# Patient Record
Sex: Male | Born: 1990 | Race: Black or African American | Hispanic: No | Marital: Single | Smoking: Current every day smoker
Health system: Southern US, Community
[De-identification: ages and names within clinical notes are randomized; demographics above are authoritative.]

## PROBLEM LIST (undated history)

## (undated) DIAGNOSIS — R55 Syncope and collapse: Secondary | ICD-10-CM

---

## 2012-03-11 ENCOUNTER — Emergency Department (HOSPITAL_COMMUNITY)
Admission: EM | Admit: 2012-03-11 | Discharge: 2012-03-11 | Disposition: A | Discharge status: Home / Self Care | Payer: Medicaid Other | Attending: Emergency Medicine | Admitting: Emergency Medicine

## 2012-03-11 ENCOUNTER — Encounter (HOSPITAL_COMMUNITY): Payer: Self-pay

## 2012-03-11 DIAGNOSIS — J988 Other specified respiratory disorders: Secondary | ICD-10-CM

## 2012-03-11 DIAGNOSIS — B9789 Other viral agents as the cause of diseases classified elsewhere: Secondary | ICD-10-CM

## 2012-03-11 DIAGNOSIS — F17200 Nicotine dependence, unspecified, uncomplicated: Secondary | ICD-10-CM | POA: Insufficient documentation

## 2012-03-11 DIAGNOSIS — H9209 Otalgia, unspecified ear: Secondary | ICD-10-CM

## 2012-03-11 MED ORDER — ANTIPYRINE-BENZOCAINE 5.4-1.4 % OT SOLN
3.0000 [drp] | Freq: Once | OTIC | Status: AC
  Administered 2012-03-11: 3 [drp] via OTIC
  Filled 2012-03-11: qty 10

## 2012-03-11 NOTE — ED Notes (Signed)
Pt discharged. Pt stable at time of discharge. Medications reviewed pt has no questions regarding discharge at this time. Pt voiced understanding of discharge instructions.  

## 2012-03-11 NOTE — ED Notes (Signed)
Right ear pain per pt. Started yesterday per pt.

## 2012-03-11 NOTE — ED Provider Notes (Signed)
History     CSN: [id number]  Arrival date & time 03/11/12  0040   First MD Initiated Contact with Patient 03/11/12 [phone]      Chief Complaint  Patient presents with  . Otalgia    (Consider location/radiation/quality/duration/timing/severity/associated sxs/prior treatment) HPI  Hunter Perkins is a 21 y.o. male who presents to the Emergency Department complaining of right ear pain that began yesterday when yawning, chewing, opening mouth wide. He has had cold symptoms for several days. Denies fever, chills, ear drainage.  History reviewed. No pertinent past medical history.  History reviewed. No pertinent past surgical history.  History reviewed. No pertinent family history.  History  Substance Use Topics  . Smoking status: Current Everyday Smoker -- 1.5 packs/day    Types: Cigarettes  . Smokeless tobacco: Not on file  . Alcohol Use: No      Review of Systems  Constitutional: Negative for fever.       10 Systems reviewed and are negative for acute change except as noted in the HPI.  HENT: Positive for ear pain, congestion, sneezing and postnasal drip.   Eyes: Negative for discharge and redness.  Respiratory: Positive for cough. Negative for shortness of breath.   Cardiovascular: Negative for chest pain.  Gastrointestinal: Negative for vomiting and abdominal pain.  Musculoskeletal: Negative for back pain.  Skin: Negative for rash.  Neurological: Negative for syncope, numbness and headaches.  Psychiatric/Behavioral:       No behavior change.    Allergies  Review of patient's allergies indicates no known allergies.  Home Medications  No current outpatient prescriptions on file.  BP 121/81  Pulse 75  Temp 98.4 F (36.9 C) (Oral)  Resp 16  Ht 6' (1.829 m)  Wt 210 lb (95.255 kg)  BMI 28.48 kg/m2  SpO2 99%  Physical Exam  Nursing note and vitals reviewed. Constitutional: He is oriented to person, place, and time. He appears well-developed and well-nourished.       Awake, alert, nontoxic appearance.  HENT:  Head: Normocephalic and atraumatic.  Right Ear: External ear normal.  Left Ear: External ear normal.  Mouth/Throat: Oropharynx is clear and moist.  Eyes: Conjunctivae and EOM are normal. Pupils are equal, round, and reactive to light. Right eye exhibits no discharge. Left eye exhibits no discharge.  Neck: Neck supple.  Cardiovascular: Normal heart sounds.   Pulmonary/Chest: Effort normal. No respiratory distress. He has no wheezes. He has no rales. He exhibits no tenderness.  Abdominal: Soft. There is no tenderness. There is no rebound.  Musculoskeletal: He exhibits no tenderness.       Baseline ROM, no obvious new focal weakness.  Neurological: He is alert and oriented to person, place, and time.       Mental status and motor strength appears baseline for patient and situation.  Skin: No rash noted.  Psychiatric: He has a normal mood and affect.    ED Course  Procedures (including critical care time)  Labs Reviewed - No data to display No results found.   No diagnosis found.    MDM  Patient with cold symptoms for several days that began having right ear pain. PE unremarkable. Given auralgan ear drops with some relief.Pt stable in ED with no significant deterioration in condition.The patient appears reasonably screened and/or stabilized for discharge and I doubt any other medical condition or other [facility] requiring further screening, evaluation, or treatment in the ED at this time prior to discharge.  MDM Reviewed: nursing note and vitals  Nicoletta Dress. Colon Branch, MD 03/11/12 [phone]

## 2012-05-08 ENCOUNTER — Emergency Department (HOSPITAL_COMMUNITY): Payer: Medicaid Other

## 2012-05-08 ENCOUNTER — Encounter (HOSPITAL_COMMUNITY): Payer: Self-pay | Admitting: *Deleted

## 2012-05-08 ENCOUNTER — Emergency Department (HOSPITAL_COMMUNITY)
Admission: EM | Admit: 2012-05-08 | Discharge: 2012-05-08 | Disposition: A | Discharge status: Home / Self Care | Payer: Medicaid Other | Attending: Emergency Medicine | Admitting: Emergency Medicine

## 2012-05-08 DIAGNOSIS — F17200 Nicotine dependence, unspecified, uncomplicated: Secondary | ICD-10-CM | POA: Insufficient documentation

## 2012-05-08 DIAGNOSIS — S93601A Unspecified sprain of right foot, initial encounter: Secondary | ICD-10-CM

## 2012-05-08 DIAGNOSIS — S93609A Unspecified sprain of unspecified foot, initial encounter: Secondary | ICD-10-CM | POA: Insufficient documentation

## 2012-05-08 DIAGNOSIS — Y9367 Activity, basketball: Secondary | ICD-10-CM | POA: Insufficient documentation

## 2012-05-08 DIAGNOSIS — Y929 Unspecified place or not applicable: Secondary | ICD-10-CM | POA: Insufficient documentation

## 2012-05-08 DIAGNOSIS — X58XXXA Exposure to other specified factors, initial encounter: Secondary | ICD-10-CM | POA: Insufficient documentation

## 2012-05-08 MED ORDER — HYDROCODONE-ACETAMINOPHEN 5-325 MG PO TABS: 1.0000 | ORAL_TABLET | Freq: Four times a day (QID) | ORAL | Status: AC | PRN

## 2012-05-08 MED ORDER — IBUPROFEN 800 MG PO TABS
800.0000 mg | ORAL_TABLET | Freq: Once | ORAL | Status: AC
  Administered 2012-05-08: 800 mg via ORAL
  Filled 2012-05-08: qty 1

## 2012-05-08 MED ORDER — HYDROCODONE-ACETAMINOPHEN 5-325 MG PO TABS
1.0000 | ORAL_TABLET | Freq: Once | ORAL | Status: AC
  Administered 2012-05-08: 1 via ORAL
  Filled 2012-05-08: qty 1

## 2012-05-08 NOTE — ED Notes (Signed)
Pain rt foot , injury when playing basketball

## 2012-05-08 NOTE — ED Provider Notes (Signed)
History     CSN: [id number]  Arrival date & time 05/08/12  1808   None     Chief Complaint  Patient presents with  . Foot Pain    (Consider location/radiation/quality/duration/timing/severity/associated sxs/prior treatment) HPI Comments: Running while playing basketball.  Attempted  Stopping quickly and inverted foot.  Denies any ankle pain.  No other complaints.  Patient is a 21 y.o. male presenting with lower extremity pain. The history is provided by the patient. No language interpreter was used.  Foot Pain This is a new problem. The current episode started today. The problem occurs constantly. The problem has been unchanged. Pertinent negatives include no numbness or weakness. The symptoms are aggravated by walking and standing. He has tried nothing for the symptoms.    History reviewed. No pertinent past medical history.  History reviewed. No pertinent past surgical history.  History reviewed. No pertinent family history.  History  Substance Use Topics  . Smoking status: Current Every Day Smoker -- 1.5 packs/day    Types: Cigarettes  . Smokeless tobacco: Not on file  . Alcohol Use: Yes     Comment: occ      Review of Systems  Musculoskeletal:       Foot inj and pain   Skin: Negative for wound.  Neurological: Negative for weakness and numbness.  All other systems reviewed and are negative.    Allergies  Review of patient's allergies indicates no known allergies.  Home Medications   Current Outpatient Rx  Name  Route  Sig  Dispense  Refill  . HYDROCODONE-ACETAMINOPHEN 5-325 MG PO TABS   Oral   Take 1 tablet by mouth every 6 (six) hours as needed for pain.   20 tablet   0     BP 121/77  Pulse 88  Temp 98.3 F (36.8 C) (Oral)  Resp 20  Ht 6\' 1"  (1.854 m)  Wt 210 lb (95.255 kg)  BMI 27.71 kg/m2  SpO2 100%  Physical Exam  Nursing note and vitals reviewed. Constitutional: He is oriented to person, place, and time. He appears well-developed and  well-nourished.  HENT:  Head: Normocephalic and atraumatic.  Eyes: EOM are normal.  Neck: Normal range of motion.  Cardiovascular: Normal rate, regular rhythm and intact distal pulses.   Pulmonary/Chest: Effort normal. No respiratory distress.  Abdominal: Soft. He exhibits no distension. There is no tenderness.  Musculoskeletal: He exhibits tenderness.       Right foot: He exhibits decreased range of motion, tenderness and bony tenderness. He exhibits no swelling, normal capillary refill, no crepitus, no deformity and no laceration.       Feet:  Neurological: He is alert and oriented to person, place, and time.  Skin: Skin is warm and dry.  Psychiatric: He has a normal mood and affect. Judgment normal.    ED Course  Procedures (including critical care time)  Labs Reviewed - No data to display Dg Foot Complete Right  05/08/2012  *RADIOLOGY REPORT*  Clinical Data: Ankle injury  RIGHT FOOT COMPLETE - 3+ VIEW  Comparison: None.  Findings: No fracture  or dislocation of midfoot or forefoot.  The phalanges are normal.  The calcaneus is normal.  No soft tissue abnormality.  IMPRESSION: No acute osseous abnormality.   Original Report Authenticated By: Genevive Bi, M.D.      1. Right foot sprain       MDM  ASO, crutches, ice, ibuprofen rx-hydrocodone, 20 F/u with dr. Hilda Lias  Evalina Field, [place] 05/08/12 [phone]

## 2012-05-08 NOTE — ED Provider Notes (Signed)
Medical screening examination/treatment/procedure(s) were performed by non-physician practitioner and as supervising physician I was immediately available for consultation/collaboration.  Cecylia Brazill, MD 05/08/12 1957 

## 2013-08-13 ENCOUNTER — Emergency Department (HOSPITAL_COMMUNITY)
Admission: EM | Admit: 2013-08-13 | Discharge: 2013-08-13 | Disposition: A | Discharge status: Home / Self Care | Payer: Medicaid Other | Attending: Emergency Medicine | Admitting: Emergency Medicine

## 2013-08-13 ENCOUNTER — Emergency Department (HOSPITAL_COMMUNITY): Payer: Medicaid Other

## 2013-08-13 ENCOUNTER — Encounter (HOSPITAL_COMMUNITY): Payer: Self-pay | Admitting: Emergency Medicine

## 2013-08-13 DIAGNOSIS — W108XXA Fall (on) (from) other stairs and steps, initial encounter: Secondary | ICD-10-CM | POA: Insufficient documentation

## 2013-08-13 DIAGNOSIS — Y929 Unspecified place or not applicable: Secondary | ICD-10-CM | POA: Insufficient documentation

## 2013-08-13 DIAGNOSIS — F17200 Nicotine dependence, unspecified, uncomplicated: Secondary | ICD-10-CM | POA: Insufficient documentation

## 2013-08-13 DIAGNOSIS — X500XXA Overexertion from strenuous movement or load, initial encounter: Secondary | ICD-10-CM | POA: Insufficient documentation

## 2013-08-13 DIAGNOSIS — S62319A Displaced fracture of base of unspecified metacarpal bone, initial encounter for closed fracture: Secondary | ICD-10-CM | POA: Insufficient documentation

## 2013-08-13 DIAGNOSIS — Y9301 Activity, walking, marching and hiking: Secondary | ICD-10-CM | POA: Insufficient documentation

## 2013-08-13 MED ORDER — HYDROCODONE-ACETAMINOPHEN 5-325 MG PO TABS: 1.0000 | ORAL_TABLET | ORAL | Status: DC | PRN

## 2013-08-13 MED ORDER — IBUPROFEN 800 MG PO TABS: 800.0000 mg | ORAL_TABLET | Freq: Three times a day (TID) | ORAL | Status: DC

## 2013-08-13 NOTE — ED Provider Notes (Signed)
Medical screening examination/treatment/procedure(s) were performed by non-physician practitioner and as supervising physician I was immediately available for consultation/collaboration.  EKG Interpretation   None        Chakia Counts R. Makaleigh Reinard, MD 08/13/13 2341 

## 2013-08-13 NOTE — ED Notes (Signed)
Holding daughter last night while going up steps and fell.  Fell on right hand and hyperextended fingers.  Right hand swollen.

## 2013-08-13 NOTE — Discharge Instructions (Signed)
You have a fracture of the bone behind the third finger of your right hand. Please keep the splint clean and dry. Please see Dr. Hilda LiasKeeling tomorrow in the office. Keep your hand elevated above your heart is much as possible. Use ibuprofen 3 times daily, use Norco every 4 hours if needed for pain. Metacarpal Fractures Fractures of metacarpals are breaks in the bones of the hand. They extend from the knuckles to the wrist. These bones can break in many ways. There are different ways of treating these fractures. HOME CARE  Only exercise as told by your doctor.  Return to activities as told by your doctor.  Go to physical therapy as told by your doctor.  Follow your doctor's advice about driving.  Keep the injured hand raised (elevated) above the level of your heart.  If a plaster, fiberglass, or pre-formed splint was applied:  Wear your splint as told and until you are examined again.  Apply ice on the injury for 15-20 minutes at a time, 03-04 times a day. Put the ice in a plastic bag. Place a towel between your skin and the bag.  Do not get your splint or cast wet. Protect it during bathing with a plastic bag.  Loosen the elastic bandage around the splint if your fingers start to get numb, tingle, get cold, or turn blue.  If the splint is plaster, do not lean it on hard surfaces or put pressure on it for 24 hours after it is put on.  Do not  try to scratch the skin under the cast.  Check the skin around the cast every day. You may put lotion on red or sore areas.  Move the fingers of your casted hand several times a day.  Only take medicine as told by your doctor.  Follow up as told by your doctor. This is very important in order to avoid permanent injury, disability, or lasting (chronic) pain. GET HELP RIGHT AWAY IF:   You develop a rash.  You have problems breathing.  You have any allergy problems.  You have more than a small spot of blood from beneath your cast or  splint.  You have redness, puffiness (swelling), or more pain from beneath your cast or splint.  Yellowish white fluid (pus) comes from beneath your cast or splint.  You develop a temperature by mouth above 102 F (38.9 C), not controlled by medicne.  You have a bad smell coming from under your cast or splint.  You have problems moving any of your fingers. If you do not have a window in your cast for looking at the wound, a fluid or a little bleeding may show up as a stain on the outside of your cast. Tell your doctor about any stains you see. MAKE SURE YOU:   Understand these instructions.  Will watch your condition.  Will get help right away if you are not doing well or get worse. Document Released: 12/06/2007 Document Revised: 09/11/2011 Document Reviewed: 05/25/2009 [facility] [employer] [employer] Patient Information 2014 [place], [place].

## 2013-08-13 NOTE — ED Provider Notes (Signed)
CSN: [id number]     Arrival date & time 08/13/13  1354 History   First MD Initiated Contact with Patient 08/13/13 1520     Chief Complaint  Patient presents with  . Hand Injury     (Consider location/radiation/quality/duration/timing/severity/associated sxs/prior Treatment) HPI Comments: Patient states that on last evening was going up steps. He was holding his daughter when he fell and hyperextended the fingers of his right hand. He noticed a pop, also noticed swelling and pain. The pain was worse this morning, so the patient came to the emergency department for evaluation. Patient denies any previous operations or procedures on the right hand. He reports no history of bleeding disorders, and he is not on any blood thinning type medications. Patient has tried Tylenol for the pain but this has not been successful.  The history is provided by the patient.    History reviewed. No pertinent past medical history. History reviewed. No pertinent past surgical history. History reviewed. No pertinent family history. History  Substance Use Topics  . Smoking status: Current Every Day Smoker -- 1.50 packs/day    Types: Cigarettes  . Smokeless tobacco: Not on file  . Alcohol Use: Yes     Comment: occ    Review of Systems  Constitutional: Negative for activity change.       All ROS Neg except as noted in HPI  HENT: Negative for nosebleeds.   Eyes: Negative for photophobia and discharge.  Respiratory: Negative for cough, shortness of breath and wheezing.   Cardiovascular: Negative for chest pain and palpitations.  Gastrointestinal: Negative for abdominal pain and blood in stool.  Genitourinary: Negative for dysuria, frequency and hematuria.  Musculoskeletal: Negative for arthralgias, back pain and neck pain.  Skin: Negative.   Neurological: Negative for dizziness, seizures and speech difficulty.  Psychiatric/Behavioral: Negative for hallucinations and confusion.      Allergies  Review  of patient's allergies indicates no known allergies.  Home Medications  No current outpatient prescriptions on file. BP 132/80  Pulse 80  Temp(Src) 98.3 F (36.8 C) (Oral)  Resp 18  SpO2 100% Physical Exam  Nursing note and vitals reviewed. Constitutional: He is oriented to person, place, and time. He appears well-developed and well-nourished.  Non-toxic appearance.  HENT:  Head: Normocephalic.  Right Ear: Tympanic membrane and external ear normal.  Left Ear: Tympanic membrane and external ear normal.  Eyes: EOM and lids are normal. Pupils are equal, round, and reactive to light.  Neck: Normal range of motion. Neck supple. Carotid bruit is not present.  Cardiovascular: Normal rate, regular rhythm, normal heart sounds, intact distal pulses and normal pulses.   Pulmonary/Chest: Breath sounds normal. No respiratory distress.  Abdominal: Soft. Bowel sounds are normal. There is no tenderness. There is no guarding.  Musculoskeletal:       Right hand: He exhibits decreased range of motion and swelling. He exhibits normal capillary refill. Normal sensation noted.       Hands: Lymphadenopathy:       Head (right side): No submandibular adenopathy present.       Head (left side): No submandibular adenopathy present.    He has no cervical adenopathy.  Neurological: He is alert and oriented to person, place, and time. He has normal strength. No cranial nerve deficit or sensory deficit.  Skin: Skin is warm and dry.  Psychiatric: He has a normal mood and affect. His speech is normal.    ED Course  Procedures (including critical care time) Labs Review Labs  Reviewed - No data to display Imaging Review Dg Hand Complete Right  08/13/2013   CLINICAL DATA:  Right hand pain and swelling after a fall last night.  EXAM: RIGHT HAND - COMPLETE 3+ VIEW  COMPARISON:  None.  FINDINGS: There is a oblique comminuted fracture through the base of the third metacarpal. I suspect the fracture extends to the  articular surface. The other osseous structures are intact.  IMPRESSION: Unusual fracture of the radial aspect of the base of the third metacarpal.   Electronically Signed   By: Geanie Cooley M.D.   On: 08/13/2013 14:32    EKG Interpretation   None       MDM   Final diagnoses:  None    *I have reviewed nursing notes, vital signs, and all appropriate lab and imaging results for this patient.** X-ray of the right hand reveals a fracture of the radial aspect of the base of the third metacarpal. Vital signs are well within normal limits. Pulse oximetry is 100% on room air. Within normal limits by my interpretation.  The case was discussed with Dr. Hilda Lias. The plan at this time is for the patient to placed in a posterior splint. Ice and elevation. Prescription for ibuprofen and Norco given. Patient is to see Dr. Hilda Lias in the office on tomorrow, February 12.   Kathie Dike, PA-C 08/13/13 [phone]

## 2014-09-14 ENCOUNTER — Emergency Department (HOSPITAL_COMMUNITY)
Admission: EM | Admit: 2014-09-14 | Discharge: 2014-09-14 | Disposition: A | Discharge status: Home / Self Care | Payer: Medicaid Other | Attending: Emergency Medicine | Admitting: Emergency Medicine

## 2014-09-14 ENCOUNTER — Encounter (HOSPITAL_COMMUNITY): Payer: Self-pay

## 2014-09-14 DIAGNOSIS — Z791 Long term (current) use of non-steroidal anti-inflammatories (NSAID): Secondary | ICD-10-CM | POA: Insufficient documentation

## 2014-09-14 DIAGNOSIS — K047 Periapical abscess without sinus: Secondary | ICD-10-CM | POA: Diagnosis not present

## 2014-09-14 DIAGNOSIS — K088 Other specified disorders of teeth and supporting structures: Secondary | ICD-10-CM | POA: Diagnosis present

## 2014-09-14 DIAGNOSIS — Z720 Tobacco use: Secondary | ICD-10-CM | POA: Insufficient documentation

## 2014-09-14 MED ORDER — AMOXICILLIN 500 MG PO CAPS: 500.0000 mg | ORAL_CAPSULE | Freq: Three times a day (TID) | ORAL | Status: AC

## 2014-09-14 MED ORDER — TRAMADOL HCL 50 MG PO TABS: 50.0000 mg | ORAL_TABLET | Freq: Four times a day (QID) | ORAL | Status: DC | PRN

## 2014-09-14 NOTE — ED Notes (Signed)
Pt c/o dental pain x 2 days.  

## 2014-09-14 NOTE — Discharge Instructions (Signed)
Dental Abscess A dental abscess is a collection of infected fluid (pus) from a bacterial infection in the inner part of the tooth (pulp). It usually occurs at the end of the tooth's root.  CAUSES   Severe tooth decay.  Trauma to the tooth that allows bacteria to enter into the pulp, such as a broken or chipped tooth. SYMPTOMS   Severe pain in and around the infected tooth.  Swelling and redness around the abscessed tooth or in the mouth or face.  Tenderness.  Pus drainage.  Bad breath.  Bitter taste in the mouth.  Difficulty swallowing.  Difficulty opening the mouth.  Nausea.  Vomiting.  Chills.  Swollen neck glands. DIAGNOSIS   A medical and dental history will be taken.  An examination will be performed by tapping on the abscessed tooth.  X-rays may be taken of the tooth to identify the abscess. TREATMENT The goal of treatment is to eliminate the infection. You may be prescribed antibiotic medicine to stop the infection from spreading. A root canal may be performed to save the tooth. If the tooth cannot be saved, it may be pulled (extracted) and the abscess may be drained.  HOME CARE INSTRUCTIONS  Only take over-the-counter or prescription medicines for pain, fever, or discomfort as directed by your caregiver.  Rinse your mouth (gargle) often with salt water ( tsp salt in 8 oz [250 ml] of warm water) to relieve pain or swelling.  Do not drive after taking pain medicine (narcotics).  Do not apply heat to the outside of your face.  Return to your dentist for further treatment as directed. SEEK MEDICAL CARE IF:  Your pain is not helped by medicine.  Your pain is getting worse instead of better. SEEK IMMEDIATE MEDICAL CARE IF:  You have a fever or persistent symptoms for more than 2-3 days.  You have a fever and your symptoms suddenly get worse.  You have chills or a very bad headache.  You have problems breathing or swallowing.  You have trouble  opening your mouth.  You have swelling in the neck or around the eye. Document Released: 06/19/2005 Document Revised: 03/13/2012 Document Reviewed: 09/27/2010 [facility] Patient Information 2015 [place], [place]. This information is not intended to replace advice given to you by your health care provider. Make sure you discuss any questions you have with your health care provider.  Complete your entire course of antibiotics as prescribed.  You  may use the tramadol for pain relief but do not drive within 4 hours of taking as this will make you drowsy.  Avoid applying heat or ice to this abscess area which can worsen your symptoms.    Keep your appointment with your dentist as planned.

## 2014-09-14 NOTE — ED Provider Notes (Signed)
CSN: [id number]     Arrival date & time 09/14/14  1658 History  This chart was scribed for non-physician practitioner, Burgess AmorJulie Kaelon Weekes, PA-C, working with Mancel BaleElliott Wentz, MD, by Ronney LionSuzanne Le, ED Scribe. This patient was seen in room APFT24/APFT24 and the patient's care was started at 5:52 PM.    Chief Complaint  Patient presents with  . Dental Pain   The history is provided by the patient. No language interpreter was used.     HPI Comments: Hunter Perkins is a 24 y.o. male who presents to the Emergency Department complaining of intermittent, lower right dental pain that has been ongoing for 3-4 months but which acutely worsened 2 days ago. He complains of associated gingival swelling around the tooth, and noticed drainage from the area at one point when it "burst." He has tried 5-6 BC powder doses a day with moderate relief, but has concerns that he may be taking too many. Patient [place]. Patient has NKDA.  History reviewed. No pertinent past medical history. History reviewed. No pertinent past surgical history. No family history on file. History  Substance Use Topics  . Smoking status: Current Every Day Smoker -- 1.50 packs/day    Types: Cigarettes  . Smokeless tobacco: Not on file  . Alcohol Use: Yes     Comment: occ    Review of Systems  Constitutional: Negative for fever.  HENT: Positive for dental problem. Negative for facial swelling and sore throat.   Respiratory: Negative for shortness of breath.   Musculoskeletal: Negative for neck pain and neck stiffness.      Allergies  Review of patient's allergies indicates no known allergies.  Home Medications   Prior to Admission medications   Medication Sig Start Date End Date Taking? Authorizing Provider  amoxicillin (AMOXIL) 500 MG capsule Take 1 capsule (500 mg total) by mouth 3 (three) times daily. 09/14/14 09/24/14  Burgess AmorJulie Zell Doucette, PA-C  HYDROcodone-acetaminophen (NORCO) 5-325 MG per  tablet Take 1 tablet by mouth every 4 (four) hours as needed for moderate pain. 08/13/13   Ivery QualeHobson Bryant, PA-C  ibuprofen (ADVIL,MOTRIN) 800 MG tablet Take 1 tablet (800 mg total) by mouth 3 (three) times daily. 08/13/13   Ivery QualeHobson Bryant, PA-C  traMADol (ULTRAM) 50 MG tablet Take 1 tablet (50 mg total) by mouth every 6 (six) hours as needed. 09/14/14   Burgess AmorJulie Aricela Bertagnolli, PA-C   BP 126/75 mmHg  Pulse 76  Temp(Src) 98.2 F (36.8 C) (Oral)  Resp 20  Ht 6\' 1"  (1.854 m)  Wt 210 lb (95.255 kg)  BMI 27.71 kg/m2  SpO2 100% Physical Exam  Constitutional: He is oriented to person, place, and time. He appears well-developed and well-nourished. No distress.  HENT:  Head: Normocephalic and atraumatic.  Right Ear: Tympanic membrane and external ear normal.  Left Ear: Tympanic membrane and external ear normal.  Mouth/Throat: Oropharynx is clear and moist and mucous membranes are normal. No oral lesions. Dental abscesses present.  Suspected impacted right lower third molar with surrounding gingival edema, without erythema. No pus pocket appreciated. Soft sublingual region. No trismus.  Eyes: Conjunctivae are normal.  Neck: Normal range of motion. Neck supple.  Cardiovascular: Normal rate and normal heart sounds.   Pulmonary/Chest: Effort normal.  Abdominal: He exhibits no distension.  Musculoskeletal: Normal range of motion.  Lymphadenopathy:    He has no cervical adenopathy.  Neurological: He is alert and oriented to person, place, and time.  Skin: Skin is  warm and dry. No erythema.  Psychiatric: He has a normal mood and affect.  Nursing note and vitals reviewed.   ED Course  Procedures (including critical care time)  DIAGNOSTIC STUDIES: Oxygen Saturation is 100% on room air, normal by my interpretation.    COORDINATION OF CARE: 5:57 PM - Discussed treatment plan with pt at bedside which includes antibiotics and pain medication, and pt agreed to plan. Cautioned patient to limit certain activities  while taking pain medication.   Labs Review Labs Reviewed - No data to display  Imaging Review No results found.   EKG Interpretation None      MDM   Final diagnoses:  Dental abscess   Amoxil, tramadol. Encouraged f/u with dentist as planned.  The patient appears reasonably screened and/or stabilized for discharge and I doubt any other medical condition or other [facility] requiring further screening, evaluation, or treatment in the ED at this time prior to discharge.  I personally performed the services described in this documentation, which was scribed in my presence. The recorded information has been reviewed and is accurate.    Burgess Amor, PA-C 09/15/14 1300  Mancel Bale, MD 09/16/14 1524

## 2014-09-25 ENCOUNTER — Emergency Department: Payer: Self-pay | Admitting: Emergency Medicine

## 2014-09-25 LAB — URINALYSIS, COMPLETE
Bacteria: NONE SEEN
Bilirubin,UR: NEGATIVE
Blood: NEGATIVE
Glucose,UR: NEGATIVE mg/dL (ref 0–75)
KETONE: NEGATIVE
Nitrite: NEGATIVE
PH: 7 (ref 4.5–8.0)
RBC,UR: 6 /HPF (ref 0–5)
SPECIFIC GRAVITY: 1.027 (ref 1.003–1.030)
Squamous Epithelial: NONE SEEN
WBC UR: 94 /HPF (ref 0–5)

## 2014-09-26 LAB — GC/CHLAMYDIA PROBE AMP

## 2014-09-26 LAB — URINE CULTURE

## 2015-03-26 ENCOUNTER — Emergency Department: Payer: Medicaid Other

## 2015-03-26 ENCOUNTER — Emergency Department
Admission: EM | Admit: 2015-03-26 | Discharge: 2015-03-26 | Disposition: A | Discharge status: Home / Self Care | Payer: Medicaid Other | Attending: Emergency Medicine | Admitting: Emergency Medicine

## 2015-03-26 ENCOUNTER — Encounter: Payer: Self-pay | Admitting: Emergency Medicine

## 2015-03-26 DIAGNOSIS — S20212A Contusion of left front wall of thorax, initial encounter: Secondary | ICD-10-CM | POA: Diagnosis not present

## 2015-03-26 DIAGNOSIS — W1839XA Other fall on same level, initial encounter: Secondary | ICD-10-CM | POA: Insufficient documentation

## 2015-03-26 DIAGNOSIS — Y990 Civilian activity done for income or pay: Secondary | ICD-10-CM | POA: Diagnosis not present

## 2015-03-26 DIAGNOSIS — S20312A Abrasion of left front wall of thorax, initial encounter: Secondary | ICD-10-CM | POA: Diagnosis not present

## 2015-03-26 DIAGNOSIS — Z720 Tobacco use: Secondary | ICD-10-CM | POA: Diagnosis not present

## 2015-03-26 DIAGNOSIS — Y9289 Other specified places as the place of occurrence of the external cause: Secondary | ICD-10-CM | POA: Insufficient documentation

## 2015-03-26 DIAGNOSIS — Z791 Long term (current) use of non-steroidal anti-inflammatories (NSAID): Secondary | ICD-10-CM | POA: Insufficient documentation

## 2015-03-26 DIAGNOSIS — Y9389 Activity, other specified: Secondary | ICD-10-CM | POA: Insufficient documentation

## 2015-03-26 DIAGNOSIS — S29001A Unspecified injury of muscle and tendon of front wall of thorax, initial encounter: Secondary | ICD-10-CM | POA: Diagnosis present

## 2015-03-26 MED ORDER — NAPROXEN 500 MG PO TABS: 500.0000 mg | ORAL_TABLET | Freq: Two times a day (BID) | ORAL | Status: DC

## 2015-03-26 NOTE — Discharge Instructions (Signed)

## 2015-03-26 NOTE — ED Provider Notes (Signed)
[facility] Emergency Department Provider Note  ____________________________________________  Time seen: 3:20 AM  I have reviewed the triage vital signs and the nursing notes.   HISTORY  Chief Complaint Rib Injury    HPI Hunter Perkins [facility] is a 24 y.o. male who works driving a Chief Executive Officer and today around 7:00 PM he had a fall on the left side of his chest on the fork. Denies any shortness of breath with this her to take a deep breath or laugh. It hurts to turn or twist his body. No lacerations. He did not hit his head or lose consciousness.Chest pain is sharp and moderate intensity. Intermittent mainly with movements.     History reviewed. No pertinent past medical history.   There are no active problems to display for this patient.    History reviewed. No pertinent past surgical history.   Current Outpatient Rx  Name  Route  Sig  Dispense  Refill  . HYDROcodone-acetaminophen (NORCO) 5-325 MG per tablet   Oral   Take 1 tablet by mouth every 4 (four) hours as needed for moderate pain.   15 tablet   0   . ibuprofen (ADVIL,MOTRIN) 800 MG tablet   Oral   Take 1 tablet (800 mg total) by mouth 3 (three) times daily.   21 tablet   0   . naproxen (NAPROSYN) 500 MG tablet   Oral   Take 1 tablet (500 mg total) by mouth 2 (two) times daily with a meal.   14 tablet   0   . traMADol (ULTRAM) 50 MG tablet   Oral   Take 1 tablet (50 mg total) by mouth every 6 (six) hours as needed.   20 tablet   0      Allergies Review of patient's allergies indicates no known allergies.   No family history on file.  Social History Social History  Substance Use Topics  . Smoking status: Current Every Day Smoker -- 1.50 packs/day    Types: Cigarettes  . Smokeless tobacco: None  . Alcohol Use: Yes     Comment: occ    Review of Systems  Constitutional:   No fever or chills. No weight changes Eyes:   No blurry vision or double vision.  ENT:   No  sore throat. Cardiovascular:   Positive chest pain. Respiratory:   No dyspnea or cough. Gastrointestinal:   Negative for abdominal pain, vomiting and diarrhea.  No BRBPR or melena. Genitourinary:   Negative for dysuria, urinary retention, bloody urine, or difficulty urinating. Musculoskeletal:   Negative for back pain. No joint swelling or pain. Skin:   Negative for rash. Neurological:   Negative for headaches, focal weakness or numbness. Psychiatric:  No anxiety or depression.   Endocrine:  No hot/cold intolerance, changes in energy, or sleep difficulty.  10-point ROS otherwise negative.  ____________________________________________   PHYSICAL EXAM:  VITAL SIGNS: ED Triage Vitals  Enc Vitals Group     BP 03/26/15 0028 137/83 mmHg     Pulse Rate 03/26/15 0028 70     Resp 03/26/15 0028 20     Temp 03/26/15 0028 98.4 F (36.9 C)     Temp Source 03/26/15 0028 Oral     SpO2 03/26/15 0028 97 %     Weight 03/26/15 0028 194 lb (87.998 kg)     Height 03/26/15 0028  (1.88 m)     Head Cir --      Peak Flow --  Pain Score 03/26/15 0028 6     Pain Loc --      Pain Edu? --      Excl. in GC? --      Constitutional:   Alert and oriented. Well appearing and in no distress. Eyes:   No scleral icterus. No conjunctival pallor. PERRL. EOMI ENT   Head:   Normocephalic and atraumatic.   Nose:   No congestion/rhinnorhea. No septal hematoma   Mouth/Throat:   MMM, no pharyngeal erythema. No peritonsillar mass. No uvula shift.   Neck:   No stridor. No SubQ emphysema. No meningismus. Hematological/Lymphatic/Immunilogical:   No cervical lymphadenopathy. Cardiovascular:   RRR. Normal and symmetric distal pulses are present in all extremities. No murmurs, rubs, or gallops. Respiratory:   Normal respiratory effort without tachypnea nor retractions. Breath sounds are clear and equal bilaterally. No wheezes/rales/rhonchi. Gastrointestinal:   Soft and nontender. No distention.  There is no CVA tenderness.  No rebound, rigidity, or guarding. Genitourinary:   deferred Musculoskeletal:   Nontender with normal range of motion in all extremities. No joint effusions.  No lower extremity tenderness.  No edema. No midline spinal tenderness. There is left lateral chest wall tenderness to the touch along the inferior ribs in an area with a superficial skin abrasion. No crepitus step-off or deformity. No instability. Neurologic:   Normal speech and language.  CN 2-10 normal. Motor grossly intact. No pronator drift.  Normal gait. No gross focal neurologic deficits are appreciated.  Skin:    Skin is warm, dry and intact. No rash noted.  No petechiae, purpura, or bullae. There is a very superficial 1 cm abrasion on the left lateral inferior chest wall. It is not deep enough to draw any blood and has only denuded the top most surfaces of the epidermis Psychiatric:   Mood and affect are normal. Speech and behavior are normal. Patient exhibits appropriate insight and judgment.  ____________________________________________    LABS (pertinent positives/negatives) (all labs ordered are listed, but only abnormal results are displayed) Labs Reviewed - No data to display ____________________________________________   EKG    ____________________________________________    RADIOLOGY  X-ray left ribs unremarkable  ____________________________________________   PROCEDURES   ____________________________________________   INITIAL IMPRESSION / ASSESSMENT AND PLAN / ED COURSE  Pertinent labs & imaging results that were available during my care of the patient were reviewed by me and considered in my medical decision making (see chart for details).  Patient presents after blunt trauma to the chest. Consistent with chest wall contusion. X-ray is unremarkable, exam is reassuring. We'll discharge home and have him take  NSAIDs.     ____________________________________________   FINAL CLINICAL IMPRESSION(S) / ED DIAGNOSES  Final diagnoses:  Chest wall contusion, left, initial encounter      Sharman Cheek, MD 03/26/15 [phone]

## 2015-03-26 NOTE — ED Notes (Signed)
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 Denies any other injury at this time. Painful when taking a deep breath and laughing. Denies similar injury previously. Will not be filing workman's comp for this injury.

## 2015-12-01 ENCOUNTER — Encounter (HOSPITAL_COMMUNITY): Payer: Self-pay

## 2015-12-01 ENCOUNTER — Emergency Department (HOSPITAL_COMMUNITY)
Admission: EM | Admit: 2015-12-01 | Discharge: 2015-12-01 | Disposition: A | Discharge status: Home / Self Care | Payer: Self-pay | Attending: Emergency Medicine | Admitting: Emergency Medicine

## 2015-12-01 ENCOUNTER — Emergency Department (HOSPITAL_COMMUNITY): Payer: Self-pay

## 2015-12-01 DIAGNOSIS — R197 Diarrhea, unspecified: Secondary | ICD-10-CM | POA: Insufficient documentation

## 2015-12-01 DIAGNOSIS — F17210 Nicotine dependence, cigarettes, uncomplicated: Secondary | ICD-10-CM | POA: Insufficient documentation

## 2015-12-01 DIAGNOSIS — Z791 Long term (current) use of non-steroidal anti-inflammatories (NSAID): Secondary | ICD-10-CM | POA: Insufficient documentation

## 2015-12-01 DIAGNOSIS — J40 Bronchitis, not specified as acute or chronic: Secondary | ICD-10-CM | POA: Insufficient documentation

## 2015-12-01 DIAGNOSIS — R112 Nausea with vomiting, unspecified: Secondary | ICD-10-CM | POA: Insufficient documentation

## 2015-12-01 DIAGNOSIS — J069 Acute upper respiratory infection, unspecified: Secondary | ICD-10-CM | POA: Insufficient documentation

## 2015-12-01 DIAGNOSIS — K0889 Other specified disorders of teeth and supporting structures: Secondary | ICD-10-CM | POA: Insufficient documentation

## 2015-12-01 MED ORDER — ALBUTEROL SULFATE HFA 108 (90 BASE) MCG/ACT IN AERS
2.0000 | INHALATION_SPRAY | Freq: Once | RESPIRATORY_TRACT | Status: AC
  Administered 2015-12-01: 2 via RESPIRATORY_TRACT
  Filled 2015-12-01: qty 6.7

## 2015-12-01 MED ORDER — PREDNISONE 20 MG PO TABS: 40.0000 mg | ORAL_TABLET | Freq: Every day | ORAL | Status: DC

## 2015-12-01 MED ORDER — PREDNISONE 50 MG PO TABS
60.0000 mg | ORAL_TABLET | Freq: Once | ORAL | Status: AC
  Administered 2015-12-01: 60 mg via ORAL
  Filled 2015-12-01: qty 1

## 2015-12-01 MED ORDER — ONDANSETRON 4 MG PO TBDP
4.0000 mg | ORAL_TABLET | Freq: Once | ORAL | Status: AC
  Administered 2015-12-01: 4 mg via ORAL
  Filled 2015-12-01: qty 1

## 2015-12-01 MED ORDER — DOXYCYCLINE HYCLATE 100 MG PO CAPS: 100.0000 mg | ORAL_CAPSULE | Freq: Two times a day (BID) | ORAL | Status: AC

## 2015-12-01 MED ORDER — IPRATROPIUM-ALBUTEROL 0.5-2.5 (3) MG/3ML IN SOLN
3.0000 mL | Freq: Once | RESPIRATORY_TRACT | Status: AC
  Administered 2015-12-01: 3 mL via RESPIRATORY_TRACT
  Filled 2015-12-01: qty 3

## 2015-12-01 MED ORDER — BENZONATATE 100 MG PO CAPS: 100.0000 mg | ORAL_CAPSULE | Freq: Three times a day (TID) | ORAL | Status: DC

## 2015-12-01 NOTE — ED Notes (Signed)
Pt reports cold symptoms, n/v/d and fever since yesterday.  PT also says has an abscess in mouth that he gets twice a year.

## 2015-12-01 NOTE — ED Provider Notes (Signed)
CSN: [id number]     Arrival date & time 12/01/15  1546 History   First MD Initiated Contact with Patient 12/01/15 1645     Chief Complaint  Patient presents with  . URI     (Consider location/radiation/quality/duration/timing/severity/associated sxs/prior Treatment) HPI  Hunter Perkins is a 25 year old male, current smoker, otherwise healthy, who presents emergency room with complaints of 2 days of nasal congestion with productive cough, chest tightness, subjective fevers with vomiting and diarrhea with past 2 days. He denies ear pain, sore throat, chest pain, abdominal pain.  States his cough is persistent, keeping him up at night, which is green sputum, and denies hemoptysis.  He denies recent travel, rash, sweats, chills, lower extremity edema, palpitations, orthopnea.  Last episode of vomiting was greater than 6 hours ago, he is unable to tolerate fluids. Emesis is nonbloody, nonbilious. Bowel movements are watery, no hematochezia, no melena. 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 He states that today it is improved from the past several days. He has some swollen lymph nodes on the right side of his neck that are also improving.  He has no difficulty opening his mouth, swallowing, speaking.    History reviewed. No pertinent past medical history. History reviewed. No pertinent past surgical history. No family history on file. Social History  Substance Use Topics  . Smoking status: Current Every Day Smoker -- 1.50 packs/day    Types: Cigarettes  . Smokeless tobacco: None  . Alcohol Use: Yes     Comment: occ    Review of Systems  Constitutional: Positive for fever. Negative for chills, diaphoresis, activity change, appetite change and fatigue.  HENT: Positive for congestion and dental problem (improving right lower jaw "abscess" - has been there for several years).   Eyes: Negative.    Respiratory: Positive for cough, chest tightness, shortness of breath and wheezing. Negative for apnea, choking and stridor.   Cardiovascular: Negative.  Negative for chest pain, palpitations and leg swelling.  Gastrointestinal: Positive for nausea, vomiting and diarrhea. Negative for abdominal pain, constipation and abdominal distention.  Endocrine: Negative.   Genitourinary: Negative.   Musculoskeletal: Negative.   Skin: Negative.  Negative for rash.  Allergic/Immunologic: Negative.   Neurological: Negative.   All other systems reviewed and are negative.     Allergies  Review of patient's allergies indicates no known allergies.  Home Medications   Prior to Admission medications   Medication Sig Start Date End Date Taking? Authorizing Provider  benzonatate (TESSALON) 100 MG capsule Take 1 capsule (100 mg total) by mouth every 8 (eight) hours. 12/01/15   Danelle BerryLeisa Alethea Terhaar, PA-C  doxycycline (VIBRAMYCIN) 100 MG capsule Take 1 capsule (100 mg total) by mouth 2 (two) times daily. 12/01/15 12/08/15  Danelle BerryLeisa Taylon Louison, PA-C  HYDROcodone-acetaminophen (NORCO) 5-325 MG per tablet Take 1 tablet by mouth every 4 (four) hours as needed for moderate pain. 08/13/13   Ivery QualeHobson Bryant, PA-C  ibuprofen (ADVIL,MOTRIN) 800 MG tablet Take 1 tablet (800 mg total) by mouth 3 (three) times daily. 08/13/13   Ivery QualeHobson Bryant, PA-C  naproxen (NAPROSYN) 500 MG tablet Take 1 tablet (500 mg total) by mouth 2 (two) times daily with a meal. 03/26/15   Sharman CheekPhillip Stafford, MD  predniSONE (DELTASONE) 20 MG tablet Take 2 tablets (40 mg total) by mouth daily. 12/01/15   Danelle BerryLeisa Halston Fairclough, PA-C  traMADol (ULTRAM) 50 MG tablet Take 1 tablet (50 mg total)  by mouth every 6 (six) hours as needed. 09/14/14   Burgess Amor, PA-C   BP 136/65 mmHg  Pulse 86  Temp(Src) 97.7 F (36.5 C) (Temporal)  Resp 18  Ht  (1.88 m)  Wt 86.183 kg  BMI 24.38 kg/m2  SpO2 99% Physical Exam  Constitutional: He is oriented to person, place, and time. He appears  well-developed and well-nourished. No distress.  HENT:  Head: Normocephalic and atraumatic.  Mouth/Throat: No oropharyngeal exudate.  Nasal congestion with erythematous nasal mucosa, clear discharge Posterior oropharynx mildly erythematous, no exudate, uvula midline Right lower posterior molar with caries, mildly tender to palpation, no fluctuance palpated, no sublingual tenderness  Eyes: Conjunctivae and EOM are normal. Pupils are equal, round, and reactive to light. Right eye exhibits no discharge. Left eye exhibits no discharge. No scleral icterus.  Neck: Normal range of motion. Neck supple. No JVD present. No tracheal deviation present. No thyromegaly present.  Cardiovascular: Normal rate, regular rhythm, normal heart sounds and intact distal pulses.  Exam reveals no gallop and no friction rub.   No murmur heard. Pulmonary/Chest: Effort normal. No stridor. No respiratory distress. He has wheezes. He has no rales. He exhibits tenderness.  Diminished breath sounds throughout with faint expiratory wheeze, frequent cough, no rales or rhonchi, chest wall tenderness to palpation  Abdominal: Soft. Bowel sounds are normal. He exhibits no distension and no mass. There is no tenderness. There is no rebound and no guarding.  Musculoskeletal: Normal range of motion. He exhibits no edema or tenderness.  Lymphadenopathy:    He has cervical adenopathy.  Neurological: He is alert and oriented to person, place, and time. He has normal reflexes. No cranial nerve deficit. He exhibits normal muscle tone. Coordination normal.  Skin: Skin is warm and dry. No rash noted. He is not diaphoretic. No erythema. No pallor.  Psychiatric: He has a normal mood and affect. His behavior is normal. Judgment and thought content normal.  Nursing note and vitals reviewed.   ED Course  Procedures (including critical care time) Labs Review Labs Reviewed - No data to display  Imaging Review Dg Chest 2 View  12/01/2015   CLINICAL DATA:  Productive cough. Shortness of breath. Fever. Duration: 3 days. EXAM: CHEST  2 VIEW COMPARISON:  03/26/2015 FINDINGS: The heart size and mediastinal contours are within normal limits. Both lungs are clear. The visualized skeletal structures are unremarkable. IMPRESSION: No active cardiopulmonary disease. Electronically Signed   By: Gaylyn Rong M.D.   On: 12/01/2015 17:39   I have personally reviewed and evaluated these images and lab results as part of my medical decision-making.   EKG Interpretation None      MDM   25 year old male with multiple complaints including URI, cough, nausea, vomiting, diarrhea, subjective fever, dental infection.  Patient is a current smoker, the initial evaluation he appears mildly ill with nasal congestion and intermittent cough, but otherwise is nontoxic in appearance, no respiratory distress, afebrile. He is diminished throughout on lung exam with faint expiratory wheeze, which improved with breathing treatment in the ER.  Chest x-ray negative, will treat for URI and bronchitis with prednisone burst, inhaler and cough medicine. Pt also complains of an improving toothache, poor dentition,  No gross abscess.  Exam unconcerning for Ludwig's angina or spread of infection.   Urged patient to follow-up with dentist.  Maryclare Labrador give Doxy x 7d.  Pt also complained of N, V, D, was given zofran in the ER, has been tolerating PO fluids for the  past 6 hours, able to tolerate crackers in the ER, suspect it is related GI virus, return precautions reviewed.  Pt discharged in good condition, VSS.   Final diagnoses:  URI (upper respiratory infection)  Bronchitis  Pain, dental        Danelle Berry, PA-C 12/01/15 1937  Vanetta Mulders, MD 12/02/15 0202

## 2015-12-01 NOTE — ED Notes (Signed)
Pt had tolerated PO fluids, crackers provided for pt.  Pt verbalized understanding on use of inhaler and states he has used one in the past.

## 2015-12-01 NOTE — Discharge Instructions (Signed)
How to Use an Inhaler Proper inhaler technique is very important. Good technique ensures that the medicine reaches the lungs. Poor technique results in depositing the medicine on the tongue and back of the throat rather than in the airways. If you do not use the inhaler with good technique, the medicine will not help you. STEPS TO FOLLOW IF USING AN INHALER WITHOUT AN EXTENSION TUBE  Remove the cap from the inhaler.  If you are using the inhaler for the first time, you will need to prime it. Shake the inhaler for 5 seconds and release four puffs into the air, away from your face. Ask your health care provider or pharmacist if you have questions about priming your inhaler.  Shake the inhaler for 5 seconds before each breath in (inhalation).  Position the inhaler so that the top of the canister faces up.  Put your index finger on the top of the medicine canister. Your thumb supports the bottom of the inhaler.  Open your mouth.  Either place the inhaler between your teeth and place your lips tightly around the mouthpiece, or hold the inhaler 1-2 inches away from your open mouth. If you are unsure of which technique to use, ask your health care provider.  Breathe out (exhale) normally and as completely as possible.  Press the canister down with your index finger to release the medicine.  At the same time as the canister is pressed, inhale deeply and slowly until your lungs are completely filled. This should take 4-6 seconds. Keep your tongue down.  Hold the medicine in your lungs for 5-10 seconds (10 seconds is best). This helps the medicine get into the small airways of your lungs.  Breathe out slowly, through pursed lips. Whistling is an example of pursed lips.  Wait at least 15-30 seconds between puffs. Continue with the above steps until you have taken the number of puffs your health care provider has ordered. Do not use the inhaler more than your health care provider tells  you.  Replace the cap on the inhaler.  Follow the directions from your health care provider or the inhaler insert for cleaning the inhaler. STEPS TO FOLLOW IF USING AN INHALER WITH AN EXTENSION (SPACER)  Remove the cap from the inhaler.  If you are using the inhaler for the first time, you will need to prime it. Shake the inhaler for 5 seconds and release four puffs into the air, away from your face. Ask your health care provider or pharmacist if you have questions about priming your inhaler.  Shake the inhaler for 5 seconds before each breath in (inhalation).  Place the open end of the spacer onto the mouthpiece of the inhaler.  Position the inhaler so that the top of the canister faces up and the spacer mouthpiece faces you.  Put your index finger on the top of the medicine canister. Your thumb supports the bottom of the inhaler and the spacer.  Breathe out (exhale) normally and as completely as possible.  Immediately after exhaling, place the spacer between your teeth and into your mouth. Close your lips tightly around the spacer.  Press the canister down with your index finger to release the medicine.  At the same time as the canister is pressed, inhale deeply and slowly until your lungs are completely filled. This should take 4-6 seconds. Keep your tongue down and out of the way.  Hold the medicine in your lungs for 5-10 seconds (10 seconds is best). This helps the  medicine get into the small airways of your lungs. Exhale. °· Repeat inhaling deeply through the spacer mouthpiece. Again hold that breath for up to 10 seconds (10 seconds is best). Exhale slowly. If it is difficult to take this second deep breath through the spacer, breathe normally several times through the spacer. Remove the spacer from your mouth. °· Wait at least 15-30 seconds between puffs. Continue with the above steps until you have taken the number of puffs your health care provider has ordered. Do not use the  inhaler more than your health care provider tells you. °· Remove the spacer from the inhaler, and place the cap on the inhaler. °· Follow the directions from your health care provider or the inhaler insert for cleaning the inhaler and spacer. °If you are using different kinds of inhalers, use your quick relief medicine to open the airways 10-15 minutes before using a steroid if instructed to do so by your health care provider. If you are unsure which inhalers to use and the order of using them, ask your health care provider, nurse, or respiratory therapist. °If you are using a steroid inhaler, always rinse your mouth with water after your last puff, then gargle and spit out the water. Do not swallow the water. °AVOID: °· Inhaling before or after starting the spray of medicine. It takes practice to coordinate your breathing with triggering the spray. °· Inhaling through the nose (rather than the mouth) when triggering the spray. °HOW TO DETERMINE IF YOUR INHALER IS FULL OR NEARLY EMPTY °You cannot know when an inhaler is empty by shaking it. A few inhalers are now being made with dose counters. Ask your health care provider for a prescription that has a dose counter if you feel you need that extra help. If your inhaler does not have a counter, ask your health care provider to help you determine the date you need to refill your inhaler. Write the refill date on a calendar or your inhaler canister. Refill your inhaler 7-10 days before it runs out. Be sure to keep an adequate supply of medicine. This includes making sure it is not expired, and that you have a spare inhaler.  °SEEK MEDICAL CARE IF:  °· Your symptoms are only partially relieved with your inhaler. °· You are having trouble using your inhaler. °· You have some increase in phlegm. °SEEK IMMEDIATE MEDICAL CARE IF:  °· You feel little or no relief with your inhalers. You are still wheezing and are feeling shortness of breath or tightness in your chest or  both. °· You have dizziness, headaches, or a fast heart rate. °· You have chills, fever, or night sweats. °· You have a noticeable increase in phlegm production, or there is blood in the phlegm. °MAKE SURE YOU:  °· Understand these instructions. °· Will watch your condition. °· Will get help right away if you are not doing well or get worse. °  °This information is not intended to replace advice given to you by your health care provider. Make sure you discuss any questions you have with your health care provider. °  °Document Released: 06/16/2000 Document Revised: 04/09/2013 Document Reviewed: 01/16/2013 °Elsevier Interactive Patient Education ©2016 Elsevier Inc. ° °Upper Respiratory Infection, Adult °Most upper respiratory infections (URIs) are caused by a virus. A URI affects the nose, throat, and upper air passages. The most common type of URI is often called "the common cold." °HOME CARE  °· Take medicines only as told by your doctor. °·   Gargle warm saltwater or take cough drops to comfort your throat as told by your doctor.  Use a warm mist humidifier or inhale steam from a shower to increase air moisture. This may make it easier to breathe.  Drink enough fluid to keep your pee (urine) clear or pale yellow.  Eat soups and other clear broths.  Have a healthy diet.  Rest as needed.  Go back to work when your fever is gone or your doctor says it is okay.  You may need to stay home longer to avoid giving your URI to others.  You can also wear a face mask and wash your hands often to prevent spread of the virus.  Use your inhaler more if you have asthma.  Do not use any tobacco products, including cigarettes, chewing tobacco, or electronic cigarettes. If you need help quitting, ask your doctor. GET HELP IF:  You are getting worse, not better.  Your symptoms are not helped by medicine.  You have chills.  You are getting more short of breath.  You have brown or red mucus.  You have  yellow or brown discharge from your nose.  You have pain in your face, especially when you bend forward.  You have a fever.  You have puffy (swollen) neck glands.  You have pain while swallowing.  You have white areas in the back of your throat. GET HELP RIGHT AWAY IF:   You have very bad or constant:  Headache.  Ear pain.  Pain in your forehead, behind your eyes, and over your cheekbones (sinus pain).  Chest pain.  You have long-lasting (chronic) lung disease and any of the following:  Wheezing.  Long-lasting cough.  Coughing up blood.  A change in your usual mucus.  You have a stiff neck.  You have changes in your:  Vision.  Hearing.  Thinking.  Mood. MAKE SURE YOU:   Understand these instructions.  Will watch your condition.  Will get help right away if you are not doing well or get worse.   This information is not intended to replace advice given to you by your health care provider. Make sure you discuss any questions you have with your health care provider.   Document Released: 12/06/2007 Document Revised: 11/03/2014 Document Reviewed: 09/24/2013 Elsevier Interactive Patient Education 2016 Elsevier Inc. Acute Bronchitis Bronchitis is inflammation of the airways that extend from the windpipe into the lungs (bronchi). The inflammation often causes mucus to develop. This leads to a cough, which is the most common symptom of bronchitis.  In acute bronchitis, the condition usually develops suddenly and goes away over time, usually in a couple weeks. Smoking, allergies, and asthma can make bronchitis worse. Repeated episodes of bronchitis may cause further lung problems.  CAUSES Acute bronchitis is most often caused by the same virus that causes a cold. The virus can spread from person to person (contagious) through coughing, sneezing, and touching contaminated objects. SIGNS AND SYMPTOMS   Cough.   Fever.   Coughing up mucus.   Body aches.    Chest congestion.   Chills.   Shortness of breath.   Sore throat.  DIAGNOSIS  Acute bronchitis is usually diagnosed through a physical exam. Your health care provider will also ask you questions about your medical history. Tests, such as chest X-rays, are sometimes done to rule out other conditions.  TREATMENT  Acute bronchitis usually goes away in a couple weeks. Oftentimes, no medical treatment is necessary. Medicines are sometimes given for  relief of fever or cough. Antibiotic medicines are usually not needed but may be prescribed in certain situations. In some cases, an inhaler may be recommended to help reduce shortness of breath and control the cough. A cool mist vaporizer may also be used to help thin bronchial secretions and make it easier to clear the chest.  HOME CARE INSTRUCTIONS  Get plenty of rest.   Drink enough fluids to keep your urine clear or pale yellow (unless you have a medical condition that requires fluid restriction). Increasing fluids may help thin your respiratory secretions (sputum) and reduce chest congestion, and it will prevent dehydration.   Take medicines only as directed by your health care provider.  If you were prescribed an antibiotic medicine, finish it all even if you start to feel better.  Avoid smoking and secondhand smoke. Exposure to cigarette smoke or irritating chemicals will make bronchitis worse. If you are a smoker, consider using nicotine gum or skin patches to help control withdrawal symptoms. Quitting smoking will help your lungs heal faster.   Reduce the chances of another bout of acute bronchitis by washing your hands frequently, avoiding people with cold symptoms, and trying not to touch your hands to your mouth, nose, or eyes.   Keep all follow-up visits as directed by your health care provider.  SEEK MEDICAL CARE IF: Your symptoms do not improve after 1 week of treatment.  SEEK IMMEDIATE MEDICAL CARE IF:  You develop  an increased fever or chills.   You have chest pain.   You have severe shortness of breath.  You have bloody sputum.   You develop dehydration.  You faint or repeatedly feel like you are going to pass out.  You develop repeated vomiting.  You develop a severe headache. MAKE SURE YOU:   Understand these instructions.  Will watch your condition.  Will get help right away if you are not doing well or get worse.   This information is not intended to replace advice given to you by your health care provider. Make sure you discuss any questions you have with your health care provider.   Document Released: 07/27/2004 Document Revised: 07/10/2014 Document Reviewed: 12/10/2012 Elsevier Interactive Patient Education [employer]

## 2016-04-26 ENCOUNTER — Encounter (HOSPITAL_COMMUNITY): Payer: Self-pay | Admitting: Emergency Medicine

## 2016-04-26 ENCOUNTER — Emergency Department (HOSPITAL_COMMUNITY)
Admission: EM | Admit: 2016-04-26 | Discharge: 2016-04-26 | Disposition: A | Discharge status: Home / Self Care | Payer: Medicaid Other | Attending: Emergency Medicine | Admitting: Emergency Medicine

## 2016-04-26 DIAGNOSIS — R55 Syncope and collapse: Secondary | ICD-10-CM | POA: Insufficient documentation

## 2016-04-26 DIAGNOSIS — F17210 Nicotine dependence, cigarettes, uncomplicated: Secondary | ICD-10-CM | POA: Insufficient documentation

## 2016-04-26 LAB — URINALYSIS, ROUTINE W REFLEX MICROSCOPIC
Bilirubin Urine: NEGATIVE
Glucose, UA: NEGATIVE mg/dL
Hgb urine dipstick: NEGATIVE
KETONES UR: NEGATIVE mg/dL
LEUKOCYTES UA: NEGATIVE
NITRITE: NEGATIVE
PH: 6 (ref 5.0–8.0)
Protein, ur: NEGATIVE mg/dL
SPECIFIC GRAVITY, URINE: 1.01 (ref 1.005–1.030)

## 2016-04-26 LAB — BASIC METABOLIC PANEL
ANION GAP: 4 — AB (ref 5–15)
BUN: 12 mg/dL (ref 6–20)
CO2: 29 mmol/L (ref 22–32)
Calcium: 9 mg/dL (ref 8.9–10.3)
Chloride: 102 mmol/L (ref 101–111)
Creatinine, Ser: 1.05 mg/dL (ref 0.61–1.24)
Glucose, Bld: 78 mg/dL (ref 65–99)
POTASSIUM: 4.2 mmol/L (ref 3.5–5.1)
SODIUM: 135 mmol/L (ref 135–145)

## 2016-04-26 LAB — CBC
HEMATOCRIT: 44.8 % (ref 39.0–52.0)
HEMOGLOBIN: 15.2 g/dL (ref 13.0–17.0)
MCH: 29.5 pg (ref 26.0–34.0)
MCHC: 33.9 g/dL (ref 30.0–36.0)
MCV: 86.8 fL (ref 78.0–100.0)
Platelets: 234 10*3/uL (ref 150–400)
RBC: 5.16 MIL/uL (ref 4.22–5.81)
RDW: 13.7 % (ref 11.5–15.5)
WBC: 6.4 10*3/uL (ref 4.0–10.5)

## 2016-04-26 LAB — CBG MONITORING, ED: Glucose-Capillary: 120 mg/dL — ABNORMAL HIGH (ref 65–99)

## 2016-04-26 NOTE — ED Provider Notes (Addendum)
[facility] DEPT Provider Note   CSN: [id number] Arrival date & time: 04/26/16  1722     History   Chief Complaint Chief Complaint  Patient presents with  . Loss of Consciousness    HPI Hunter Perkins is a 25 y.o. male.  He is here for evaluation of an episode of syncope. The syncope was preceded by feeling hot, nauseated. It occurred prior to him eating much today. He was at [employer] and had "taken a few bites", before he passed out. Prior to that, he had been at the plasma center donating. After he awoke from about a minute of syncope, he was able to eat and walk and refused EMS transport. He came here by private vehicle. No other recent illnesses. He denies cough, chest pain, back pain, headache, blurred vision, nausea or vomiting. There are no other known modifying factors.  HPI  History reviewed. No pertinent past medical history.  There are no active problems to display for this patient.   History reviewed. No pertinent surgical history.     Home Medications    Prior to Admission medications   Not on File    Family History History reviewed. No pertinent family history.  Social History Social History  Substance Use Topics  . Smoking status: Current Every Day Smoker    Packs/day: 1.50    Types: Cigarettes  . Smokeless tobacco: Never Used  . Alcohol use Yes     Comment: occ     Allergies   Review of patient's allergies indicates no known allergies.   Review of Systems Review of Systems  All other systems reviewed and are negative.    Physical Exam Updated Vital Signs BP 115/78 (BP Location: Right Arm)   Pulse 76   Temp 98 F (36.7 C) (Oral)   Resp 17   Ht 6\' 2"  (1.88 m)   Wt 192 lb (87.1 kg)   SpO2 100%   BMI 24.65 kg/m   Physical Exam  Constitutional: He is oriented to person, place, and time. He appears well-developed and well-nourished. No distress.  HENT:  Head: Normocephalic and atraumatic.  Right Ear: External ear  normal.  Left Ear: External ear normal.  Eyes: Conjunctivae and EOM are normal. Pupils are equal, round, and reactive to light.  Neck: Normal range of motion and phonation normal. Neck supple.  Cardiovascular: Normal rate, regular rhythm and normal heart sounds.   Pulmonary/Chest: Effort normal and breath sounds normal. He exhibits no bony tenderness.  Abdominal: Soft. There is no tenderness.  Musculoskeletal: Normal range of motion.  Neurological: He is alert and oriented to person, place, and time. No cranial nerve deficit or sensory deficit. He exhibits normal muscle tone. Coordination normal.  No dysarthria and aphasia or nystagmus.  Skin: Skin is warm, dry and intact.  Psychiatric: He has a normal mood and affect. His behavior is normal. Judgment and thought content normal.  Nursing note and vitals reviewed.    ED Treatments / Results  Labs (all labs ordered are listed, but only abnormal results are displayed) Labs Reviewed  BASIC METABOLIC PANEL - Abnormal; Notable for the following:       Result Value   Anion gap 4 (*)    All other components within normal limits  CBG MONITORING, ED - Abnormal; Notable for the following:    Glucose-Capillary 120 (*)    All other components within normal limits  CBC  URINALYSIS, ROUTINE W REFLEX MICROSCOPIC (NOT AT [facility])    EKG  EKG Interpretation  Date/Time:  Wednesday April 26 2016 17:52:36 EDT Ventricular Rate:  68 PR Interval:  162 QRS Duration: 88 QT Interval:  346 QTC Calculation: 367 R Axis:   10 Text Interpretation:  Normal sinus rhythm Normal ECG No significant change was found Confirmed by Effie ShyWENTZ  MD, Reno Clasby ([id number]) on 04/26/2016 8:10:10 PM       Radiology No results found.  Procedures Procedures (including critical care time)  Medications Ordered in ED Medications - No data to display   Initial Impression / Assessment and Plan / ED Course  I have reviewed the triage vital signs and the nursing  notes.  Pertinent labs & imaging results that were available during my care of the patient were reviewed by me and considered in my medical decision making (see chart for details).  Clinical Course    Medications - No data to display  Patient Vitals for the past 24 hrs:  BP Temp Temp src Pulse Resp SpO2 Height Weight  04/26/16 1947 115/78 - - 76 17 100 % - -  04/26/16 1845 - - - 68 21 98 % - -  04/26/16 1830 120/67 - - 68 19 99 % - -  04/26/16 1815 - - - 71 23 100 % - -  04/26/16 1750 116/68 98 F (36.7 C) Oral 77 16 100 % - -  04/26/16 1747 - - - - - - 6\' 2"  (1.88 m) 192 lb (87.1 kg)  04/26/16 1745 116/68 - - 77 16 100 % - -    At D/c- Reevaluation with update and discussion. After initial assessment and treatment, an updated evaluation reveals He is tolerating oral food and liquids. Orthostatics negative. Findings discussed with patient, all questions answered. [name]    Final Clinical Impressions(s) / ED Diagnoses   Final diagnoses:  Syncope, unspecified syncope type   Vasovagal episode associated with post plasma donation. Likely moderately volume depleted. Patient improved spontaneously. Doubt serious bacterial infection or metabolic instability.  Nursing Notes Reviewed/ Care Coordinated Applicable Imaging Reviewed Interpretation of Laboratory Data incorporated into ED treatment  The patient appears reasonably screened and/or stabilized for discharge and I doubt any other medical condition or other [facility] requiring further screening, evaluation, or treatment in the ED at this time prior to discharge.  Plan: Home Medications- continue; Home Treatments- rest; return here if the recommended treatment, does not improve the symptoms; Recommended follow up- PCP prn   New Prescriptions There are no discharge medications for this patient.    Mancel BaleElliott Dameon Soltis, MD 04/26/16 1952    Mancel BaleElliott Victorious Cosio, MD 04/26/16 2010

## 2016-04-26 NOTE — ED Triage Notes (Addendum)
Patient states he "passed out" at 1300 today. States "I donated plasma today and hadn't eaten at all. I think my sugar might have dropped. I called EMS and they came and checked my sugar and it was 108. I'm fine now but they told me I should come to the ER and get checked out." Patient denies pain. Patient alert, oriented, and ambulatory at triage with no assistance or difficulty. CBG in triage 120.

## 2016-08-25 ENCOUNTER — Emergency Department (HOSPITAL_COMMUNITY): Payer: 59

## 2016-08-25 ENCOUNTER — Emergency Department (HOSPITAL_COMMUNITY)
Admission: EM | Admit: 2016-08-25 | Discharge: 2016-08-25 | Disposition: A | Discharge status: Home / Self Care | Payer: 59 | Attending: Emergency Medicine | Admitting: Emergency Medicine

## 2016-08-25 ENCOUNTER — Encounter (HOSPITAL_COMMUNITY): Payer: Self-pay | Admitting: Emergency Medicine

## 2016-08-25 DIAGNOSIS — M25561 Pain in right knee: Secondary | ICD-10-CM

## 2016-08-25 DIAGNOSIS — F17210 Nicotine dependence, cigarettes, uncomplicated: Secondary | ICD-10-CM | POA: Insufficient documentation

## 2016-08-25 DIAGNOSIS — M7989 Other specified soft tissue disorders: Secondary | ICD-10-CM | POA: Insufficient documentation

## 2016-08-25 HISTORY — DX: Syncope and collapse: R55

## 2016-08-25 MED ORDER — IBUPROFEN 400 MG PO TABS
600.0000 mg | ORAL_TABLET | Freq: Once | ORAL | Status: AC
  Administered 2016-08-25: 600 mg via ORAL
  Filled 2016-08-25: qty 2

## 2016-08-25 MED ORDER — NAPROXEN 500 MG PO TABS: ORAL_TABLET | ORAL | 0 refills | Status: AC

## 2016-08-25 NOTE — Discharge Instructions (Signed)
Use ice packs for comfort and to keep the swelling down. Wear the knee immobilizer to stabilize your knee. Take the medication as prescribed. Call Dr Mort SawyersHarrison's office to get an appointment to have him recheck your knee. He is an Scientist, research (life sciences)orthopedist.

## 2016-08-25 NOTE — ED Notes (Signed)
Pt states understanding of care given and follow up instructions.  Pt ambulated with knee immobilizer from ED with SO.

## 2016-08-25 NOTE — ED Provider Notes (Signed)
[facility] Provider Note   CSN: [id number] Arrival date & time: 08/25/16  0159  Time seen 04:00 AM   History   Chief Complaint Chief Complaint  Patient presents with  . Knee Pain    HPI Hunter Perkins is a 26 y.o. male.  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 He states his knees will start to pop out of joint and then slide back in. He has never sought treatment for this. He has not had that happen recently however. He states yesterday, February 22nd he started having pain in his right knee with some swelling. He denies any known injury prior to that. He states when he walks he gets pain when he bears weight and he feels like his knee is going to buckle underneath him. He also feels a pulling sensation inside his knee. He states his pain is aching.  PCP [facility] in [place]  Past Medical History:  Diagnosis Date  . Syncopal episodes     There are no active problems to display for this patient.   History reviewed. No pertinent surgical history.     Home Medications    Prior to Admission medications   Medication Sig Start Date End Date Taking? Authorizing Provider  naproxen (NAPROSYN) 500 MG tablet Take 1 po BID with food prn pain 08/25/16   Devoria Albe, MD    Family History History reviewed. No pertinent family history.  Social History Social History  Substance Use Topics  . Smoking status: Current Every Day Smoker    Packs/day: 0.50    Types: Cigarettes  . Smokeless tobacco: Never Used  . Alcohol use Yes     Comment: occ  employed   Allergies   Patient has no known allergies.   Review of Systems Review of Systems  All other systems reviewed and are negative.    Physical Exam Updated Vital Signs BP 126/90 (BP Location: Left Arm)   Pulse 70   Temp 98.2 F (36.8 C) (Oral)   Resp 17   Ht 6\' 2"  (1.88 m)   Wt 195 lb (88.5 kg)   SpO2 100%   BMI 25.04 kg/m     Vital signs normal    Physical Exam  Constitutional: He appears well-developed and well-nourished.  HENT:  Head: Normocephalic and atraumatic.  Right Ear: External ear normal.  Left Ear: External ear normal.  Mouth/Throat: Oropharynx is clear and moist.  Eyes: Conjunctivae and EOM are normal.  Neck: Normal range of motion.  Cardiovascular: Normal rate.   Pulmonary/Chest: Effort normal. No respiratory distress.  Musculoskeletal: He exhibits edema and tenderness.  Patient's noted to have some mild diffuse swelling of his right knee. There is no obvious joint effusion felt. When I move his patella there is some crepitance laterally. He is very tender to palpation over the joint spaces however when I stress his collateral ligaments that does not cause pain.  Nursing note and vitals reviewed.    ED Treatments / Results   Radiology Dg Knee Complete 4 Views Right  Result Date: 08/25/2016 CLINICAL DATA:  Right knee pain without known injury. EXAM: RIGHT KNEE - COMPLETE 4+ VIEW COMPARISON:  None. FINDINGS: No evidence of fracture, dislocation, or joint effusion. No evidence of arthropathy or other focal bone abnormality. Soft tissues are unremarkable. IMPRESSION: No fracture, dislocation or arthrosis of the right knee. Electronically Signed   By: Caryn Bee  Chase PicketHerman M.D.   On: 08/25/2016 03:33    Procedures Procedures (including critical care time)  Medications Ordered in ED Medications  ibuprofen (ADVIL,MOTRIN) tablet 600 mg (600 mg Oral Given 08/25/16 0427)     Initial Impression / Assessment and Plan / ED Course  I have reviewed the triage vital signs and the nursing notes.  Pertinent labs & imaging results that were available during my care of the patient were reviewed by me and considered in my medical decision making (see chart for details).  Patient was given Motrin for pain. He was placed in a knee immobilizer. He was given an ice pack to use. Patient will be referred to  orthopedics.  Final Clinical Impressions(s) / ED Diagnoses   Final diagnoses:  Acute pain of right knee    New Prescriptions New Prescriptions   NAPROXEN (NAPROSYN) 500 MG TABLET    Take 1 po BID with food prn pain    Plan discharge  Devoria AlbeIva Shantara Goosby, MD, [facility]FACEP    Stanly Si, MD 08/25/16 [phone]

## 2016-08-25 NOTE — ED Triage Notes (Signed)
Pt states he was having right knee pain since around 1000 yesterday morning.  Was at work and pain got worse.  No known injury

## 2016-09-18 ENCOUNTER — Encounter: Payer: Self-pay | Admitting: *Deleted

## 2016-09-18 ENCOUNTER — Emergency Department
Admission: EM | Admit: 2016-09-18 | Discharge: 2016-09-18 | Disposition: A | Discharge status: Home / Self Care | Payer: 59 | Attending: Emergency Medicine | Admitting: Emergency Medicine

## 2016-09-18 DIAGNOSIS — J020 Streptococcal pharyngitis: Secondary | ICD-10-CM | POA: Insufficient documentation

## 2016-09-18 DIAGNOSIS — J029 Acute pharyngitis, unspecified: Secondary | ICD-10-CM | POA: Diagnosis present

## 2016-09-18 DIAGNOSIS — F17210 Nicotine dependence, cigarettes, uncomplicated: Secondary | ICD-10-CM | POA: Insufficient documentation

## 2016-09-18 MED ORDER — AMOXICILLIN 500 MG PO CAPS: 500.0000 mg | ORAL_CAPSULE | Freq: Two times a day (BID) | ORAL | 0 refills | Status: AC

## 2016-09-18 NOTE — ED Triage Notes (Signed)
States fever and sore throat since last night, awake and alert in no acute distress

## 2016-09-18 NOTE — ED Provider Notes (Signed)
[facility] Emergency Department Provider Note  ____________________________________________  Time seen: Approximately 6:23 PM  I have reviewed the triage vital signs and the nursing notes.   HISTORY  Chief Complaint Fever and Sore Throat    HPI Hunter Perkins is a 26 y.o. male presenting to the emergency department with pharyngitis and "fever". Patient states that he has not evaluated his temperature but he has had intermittent chills. Patient has had mild headache but denies congestion and rhinorrhea. Patient denies cough. No recent travel. Patient denies chest pain, chest tightness, shortness of breath, nausea and vomiting. No alleviating measures have been attempted.   Past Medical History:  Diagnosis Date  . Syncopal episodes     There are no active problems to display for this patient.   History reviewed. No pertinent surgical history.  Prior to Admission medications   Medication Sig Start Date End Date Taking? Authorizing Provider  amoxicillin (AMOXIL) 500 MG capsule Take 1 capsule (500 mg total) by mouth 2 (two) times daily. 09/18/16 09/28/16  Orvil Feil, PA-C  naproxen (NAPROSYN) 500 MG tablet Take 1 po BID with food prn pain 08/25/16   Devoria Albe, MD    Allergies Patient has no known allergies.  History reviewed. No pertinent family history.  Social History Social History  Substance Use Topics  . Smoking status: Current Every Day Smoker    Packs/day: 0.50    Types: Cigarettes  . Smokeless tobacco: Never Used  . Alcohol use Yes     Comment: occ    Review of Systems  Constitutional: Patient has had fever. Eyes: No visual changes. No discharge ENT: Patient has had pharyngitis.  Cardiovascular: no chest pain. Respiratory: no cough. No SOB. Gastrointestinal: No abdominal pain.  No nausea, no vomiting.  No diarrhea. No constipation. Genitourinary: Negative for dysuria. No hematuria Musculoskeletal: Negative for musculoskeletal  pain. Skin: Negative for rash, abrasions, lacerations, ecchymosis. Neurological: Patient has had headache, focal weakness or numbness.  ____________________________________________   PHYSICAL EXAM:  VITAL SIGNS: ED Triage Vitals  Enc Vitals Group     BP 09/18/16 1622 132/83     Pulse Rate 09/18/16 1622 71     Resp 09/18/16 1622 20     Temp 09/18/16 1622 99.3 F (37.4 C)     Temp Source 09/18/16 1622 Oral     SpO2 09/18/16 1622 100 %     Weight 09/18/16 1623 190 lb (86.2 kg)     Height 09/18/16 1623 6\' 2"  (1.88 m)     Head Circumference --      Peak Flow --      Pain Score 09/18/16 1624 7     Pain Loc --      Pain Edu? --      Excl. in GC? --     Constitutional: Alert and oriented. Well appearing and in no acute distress. Eyes: Conjunctivae are normal. PERRL. EOMI. Head: Atraumatic. ENT:      Ears: Tympanic membranes are pearly bilaterally.       Nose: No congestion/rhinnorhea.      Mouth/Throat: Posterior pharynx is erythematous with petechiae visualized. No tonsillar exudate or hypertrophy visualized. Hematological/Lymphatic/Immunilogical: Patient has tender cervical lymphadenopathy. Cardiovascular: Normal rate, regular rhythm. Normal S1 and S2.  Good peripheral circulation. Respiratory: Normal respiratory effort without tachypnea or retractions. Lungs CTAB. Good air entry to the bases with no decreased or absent breath sounds. Gastrointestinal: Bowel sounds 4 quadrants. Soft and nontender to palpation. No guarding or rigidity. No palpable  masses. No distention. No CVA tenderness. Skin:  Skin is warm, dry and intact. No rash noted. Psychiatric: Mood and affect are normal. Speech and behavior are normal. Patient exhibits appropriate insight and judgement.  ____________________________________________   LABS (all labs ordered are listed, but only abnormal results are displayed)  Labs Reviewed - No data to  display ____________________________________________  EKG   ____________________________________________  RADIOLOGY   No results found.  ____________________________________________    PROCEDURES  Procedure(s) performed:    Procedures    Medications - No data to display   ____________________________________________   INITIAL IMPRESSION / ASSESSMENT AND PLAN / ED COURSE  Pertinent labs & imaging results that were available during my care of the patient were reviewed by me and considered in my medical decision making (see chart for details).  Review of the [place] CSRS was performed in accordance of the [facility] prior to dispensing any controlled drugs.     Assessment and Plan: Streptococcal pharyngitis Patient presents to the emergency department with fever and pharyngitis. On physical exam, patient has an erythematous posterior pharynx with tender cervical lymphadenopathy. No cough. I suspect streptococcal pharyngitis. Patient was discharged with amoxicillin. Vital signs are reassuring at this time. Patient was advised to follow-up with his primary care provider in one week. All patient questions were answered.  ____________________________________________  FINAL CLINICAL IMPRESSION(S) / ED DIAGNOSES  Final diagnoses:  Strep pharyngitis      NEW MEDICATIONS STARTED DURING THIS VISIT:  New Prescriptions   AMOXICILLIN (AMOXIL) 500 MG CAPSULE    Take 1 capsule (500 mg total) by mouth 2 (two) times daily.        This chart was dictated using voice recognition software/Dragon. Despite best efforts to proofread, errors can occur which can change the meaning. Any change was purely unintentional.    Orvil FeilJaclyn M Yechezkel Fertig, PA-C 09/18/16 1905    Myrna Blazeravid Matthew Schaevitz, MD 09/19/16 [phone]

## 2016-09-18 NOTE — ED Notes (Signed)
Pt discharged to home.  Discharge instructions reviewed.  Verbalized understanding.  No questions or concerns at this time.  Teach back verified.  Pt in NAD.  No items left in ED.   

## 2017-02-19 IMAGING — DX DG CHEST 2V
2 series · 2 of 2 positions shown · non-contrast
Comparison: 03/26/2015

CLINICAL DATA: Productive cough. Shortness of breath. Fever.
Duration: 3 days.

EXAM:
CHEST  2 VIEW

[chest pa]
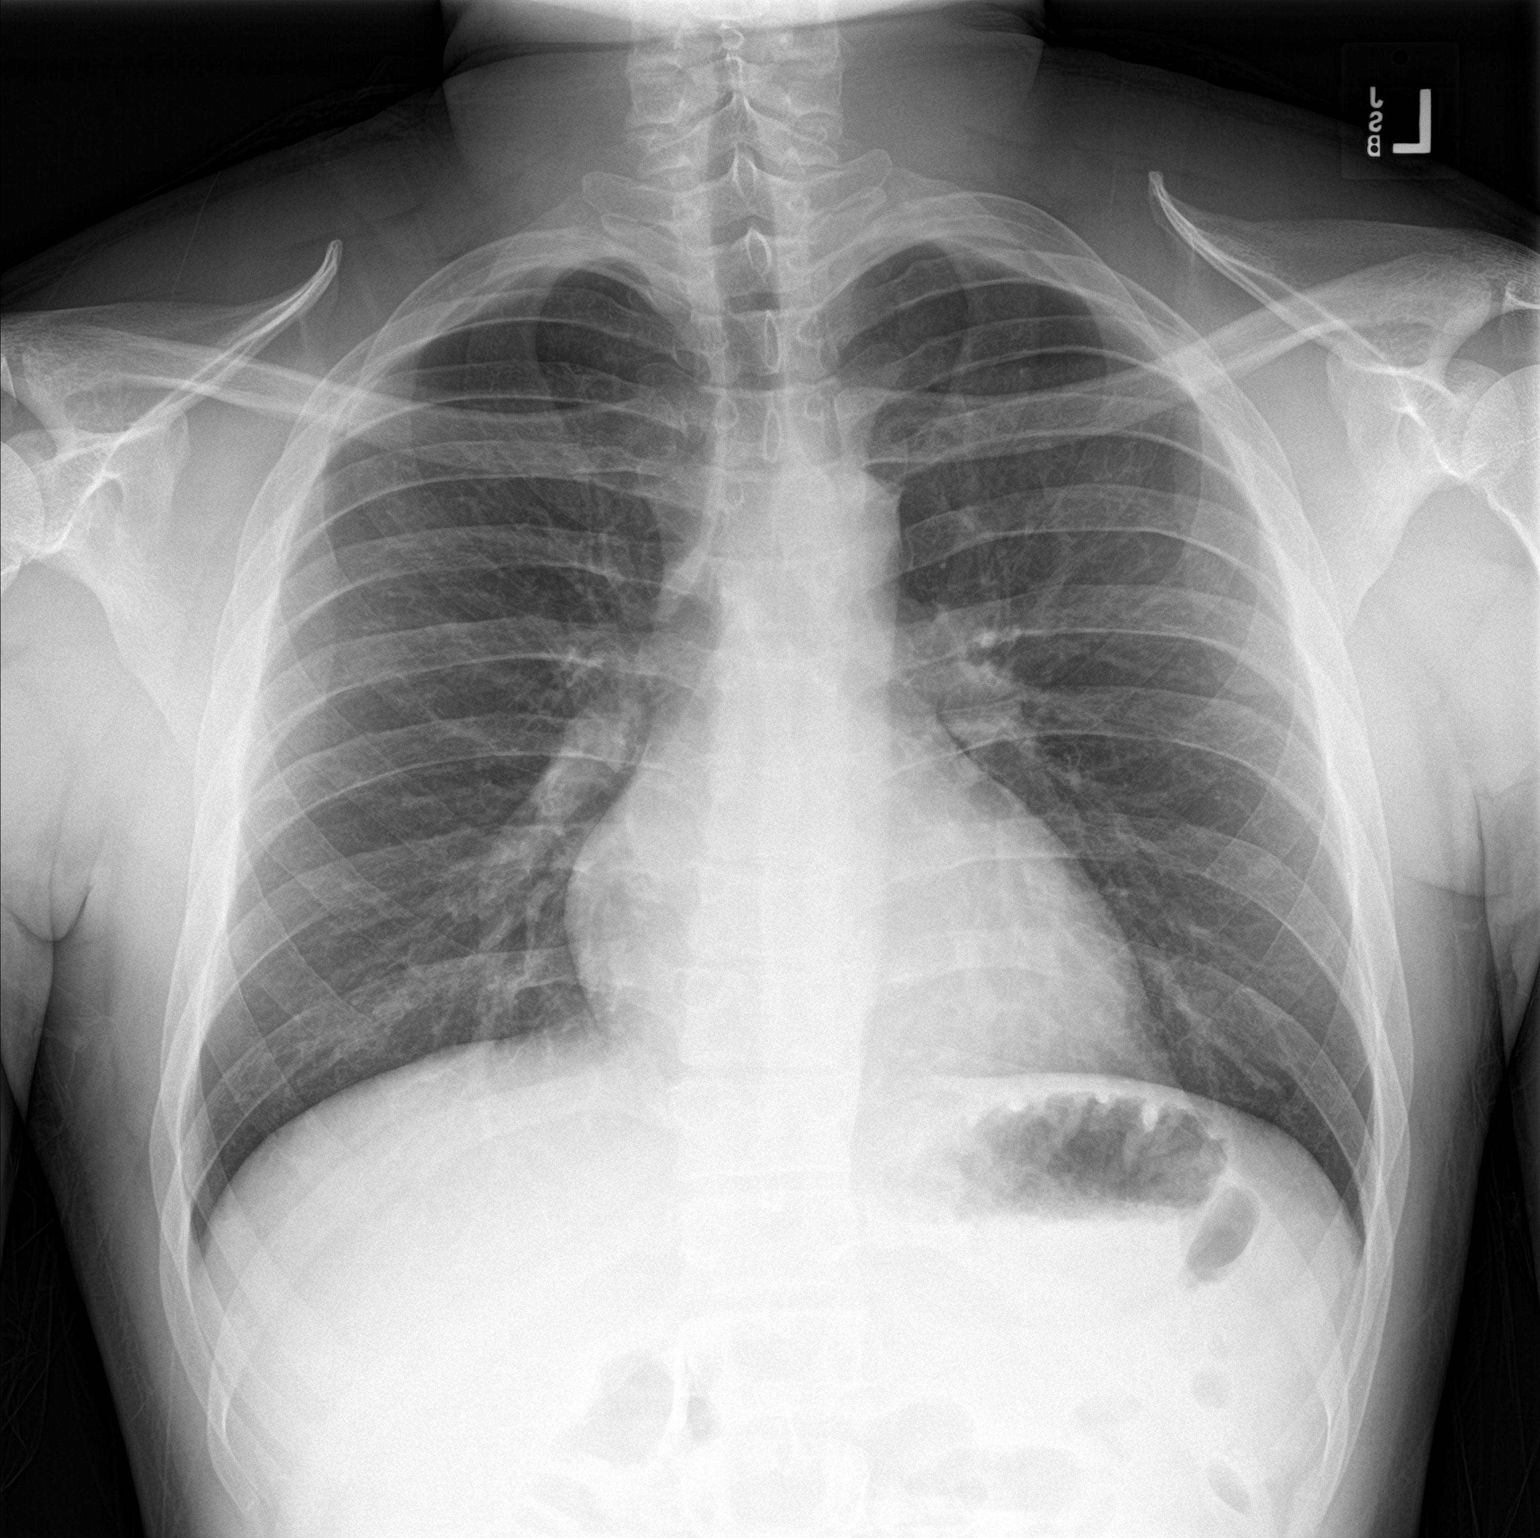

[chest lat]
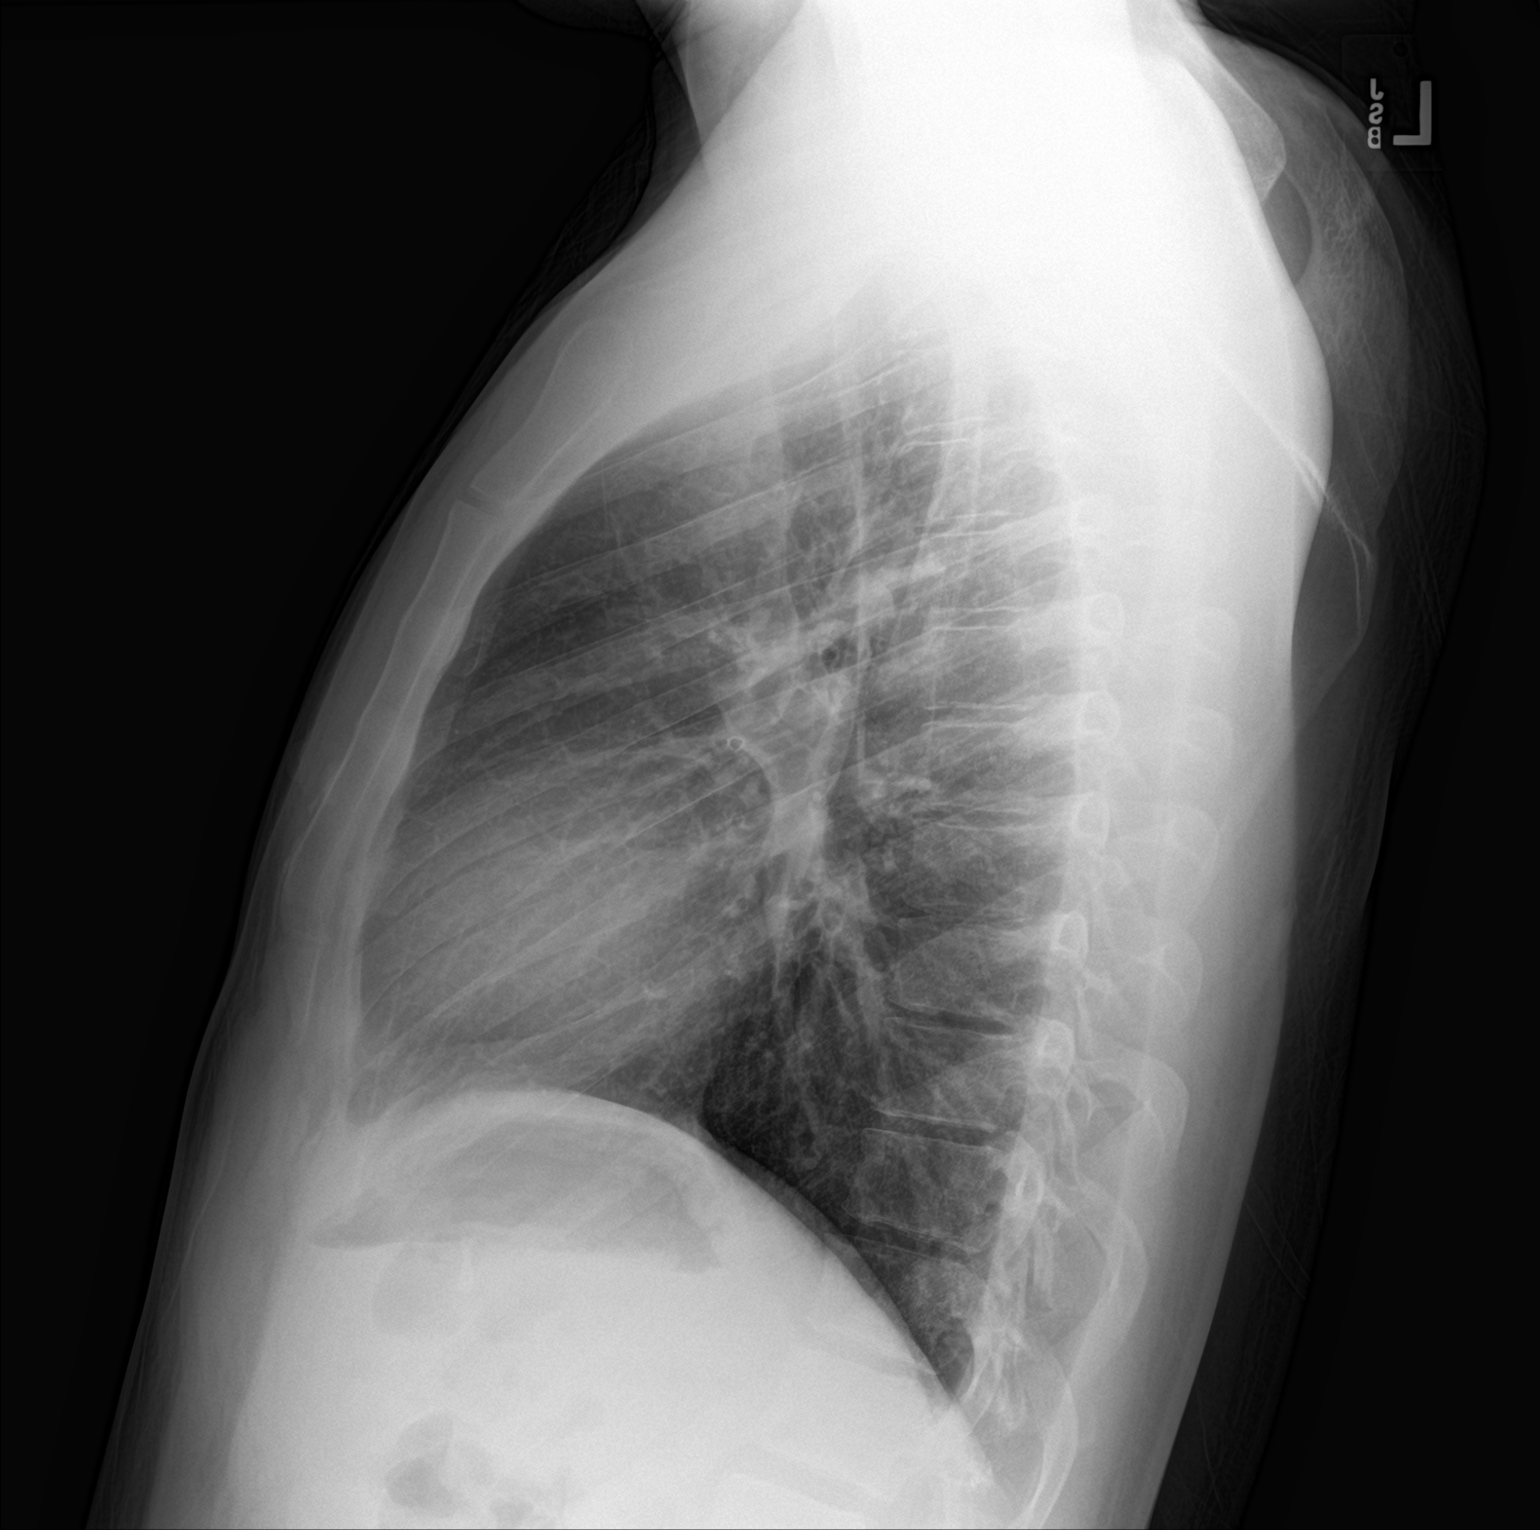

[2 of 2 positions shown; findings below may reference images not displayed]

FINDINGS: The heart size and mediastinal contours are within normal limits.
Both lungs are clear. The visualized skeletal structures are
unremarkable.
IMPRESSION: No active cardiopulmonary disease.
